# Patient Record
Sex: Female | Born: 1970 | Race: Black or African American | Hispanic: No | Marital: Single
Health system: Southern US, Community
[De-identification: ages and names within clinical notes are randomized; demographics above are authoritative.]

---

## 2019-11-21 ENCOUNTER — Emergency Department (HOSPITAL_COMMUNITY): Payer: Self-pay

## 2019-11-21 ENCOUNTER — Other Ambulatory Visit: Payer: Self-pay

## 2019-11-21 ENCOUNTER — Emergency Department (HOSPITAL_COMMUNITY)
Admission: EM | Admit: 2019-11-21 | Discharge: 2019-11-22 | Disposition: A | Discharge status: Home / Self Care | Payer: Self-pay | Attending: Emergency Medicine | Admitting: Emergency Medicine

## 2019-11-21 DIAGNOSIS — R42 Dizziness and giddiness: Secondary | ICD-10-CM | POA: Insufficient documentation

## 2019-11-21 DIAGNOSIS — Z5321 Procedure and treatment not carried out due to patient leaving prior to being seen by health care provider: Secondary | ICD-10-CM | POA: Insufficient documentation

## 2019-11-21 LAB — BASIC METABOLIC PANEL
Anion gap: 10 (ref 5–15)
BUN: 11 mg/dL (ref 6–20)
CO2: 22 mmol/L (ref 22–32)
Calcium: 9.4 mg/dL (ref 8.9–10.3)
Chloride: 103 mmol/L (ref 98–111)
Creatinine, Ser: 0.95 mg/dL (ref 0.44–1.00)
GFR calc Af Amer: >60 mL/min (ref >60)
GFR calc non Af Amer: >60 mL/min (ref >60)
Glucose, Bld: 148 mg/dL — ABNORMAL HIGH (ref 70–99)
Potassium: 3.4 mmol/L — ABNORMAL LOW (ref 3.5–5.1)
Sodium: 135 mmol/L (ref 135–145)

## 2019-11-21 LAB — I-STAT BETA HCG BLOOD, ED (MC, WL, AP ONLY): I-stat hCG, quantitative: <5 m[IU]/mL (ref <5)

## 2019-11-21 LAB — TROPONIN I (HIGH SENSITIVITY): Troponin I (High Sensitivity): <2 ng/L (ref <18)

## 2019-11-21 LAB — CBC
HCT: 35.2 % — ABNORMAL LOW (ref 36.0–46.0)
Hemoglobin: 11.2 g/dL — ABNORMAL LOW (ref 12.0–15.0)
MCH: 27.7 pg (ref 26.0–34.0)
MCHC: 31.8 g/dL (ref 30.0–36.0)
MCV: 87.1 fL (ref 80.0–100.0)
Platelets: 210 10*3/uL (ref 150–400)
RBC: 4.04 MIL/uL (ref 3.87–5.11)
RDW: 14.2 % (ref 11.5–15.5)
WBC: 6.5 10*3/uL (ref 4.0–10.5)
nRBC: 0 % (ref 0.0–0.2)

## 2019-11-21 MED ORDER — SODIUM CHLORIDE 0.9% FLUSH: 3.0000 mL | Freq: Once | INTRAVENOUS | Status: DC

## 2019-11-21 NOTE — ED Triage Notes (Signed)
Pt reports about one hour ago while lying on the cough she began to have pressure in the back of her neck and in her left upper back, denies any cp or sob. Pt reports feeling weak all over but no weakness noted in grip strength no drift in arms or legs. Speech clear, no change in vision. Pt is alert and ox4.

## 2019-11-22 NOTE — ED Notes (Signed)
Patient expresses desire to leave and her ride is on the way

## 2021-12-02 IMAGING — DX DG CHEST 2V
2 series · 2 of 2 positions shown · non-contrast
Comparison: None.

CLINICAL DATA: Back pain

EXAM:
CHEST - 2 VIEW

[chest pa]
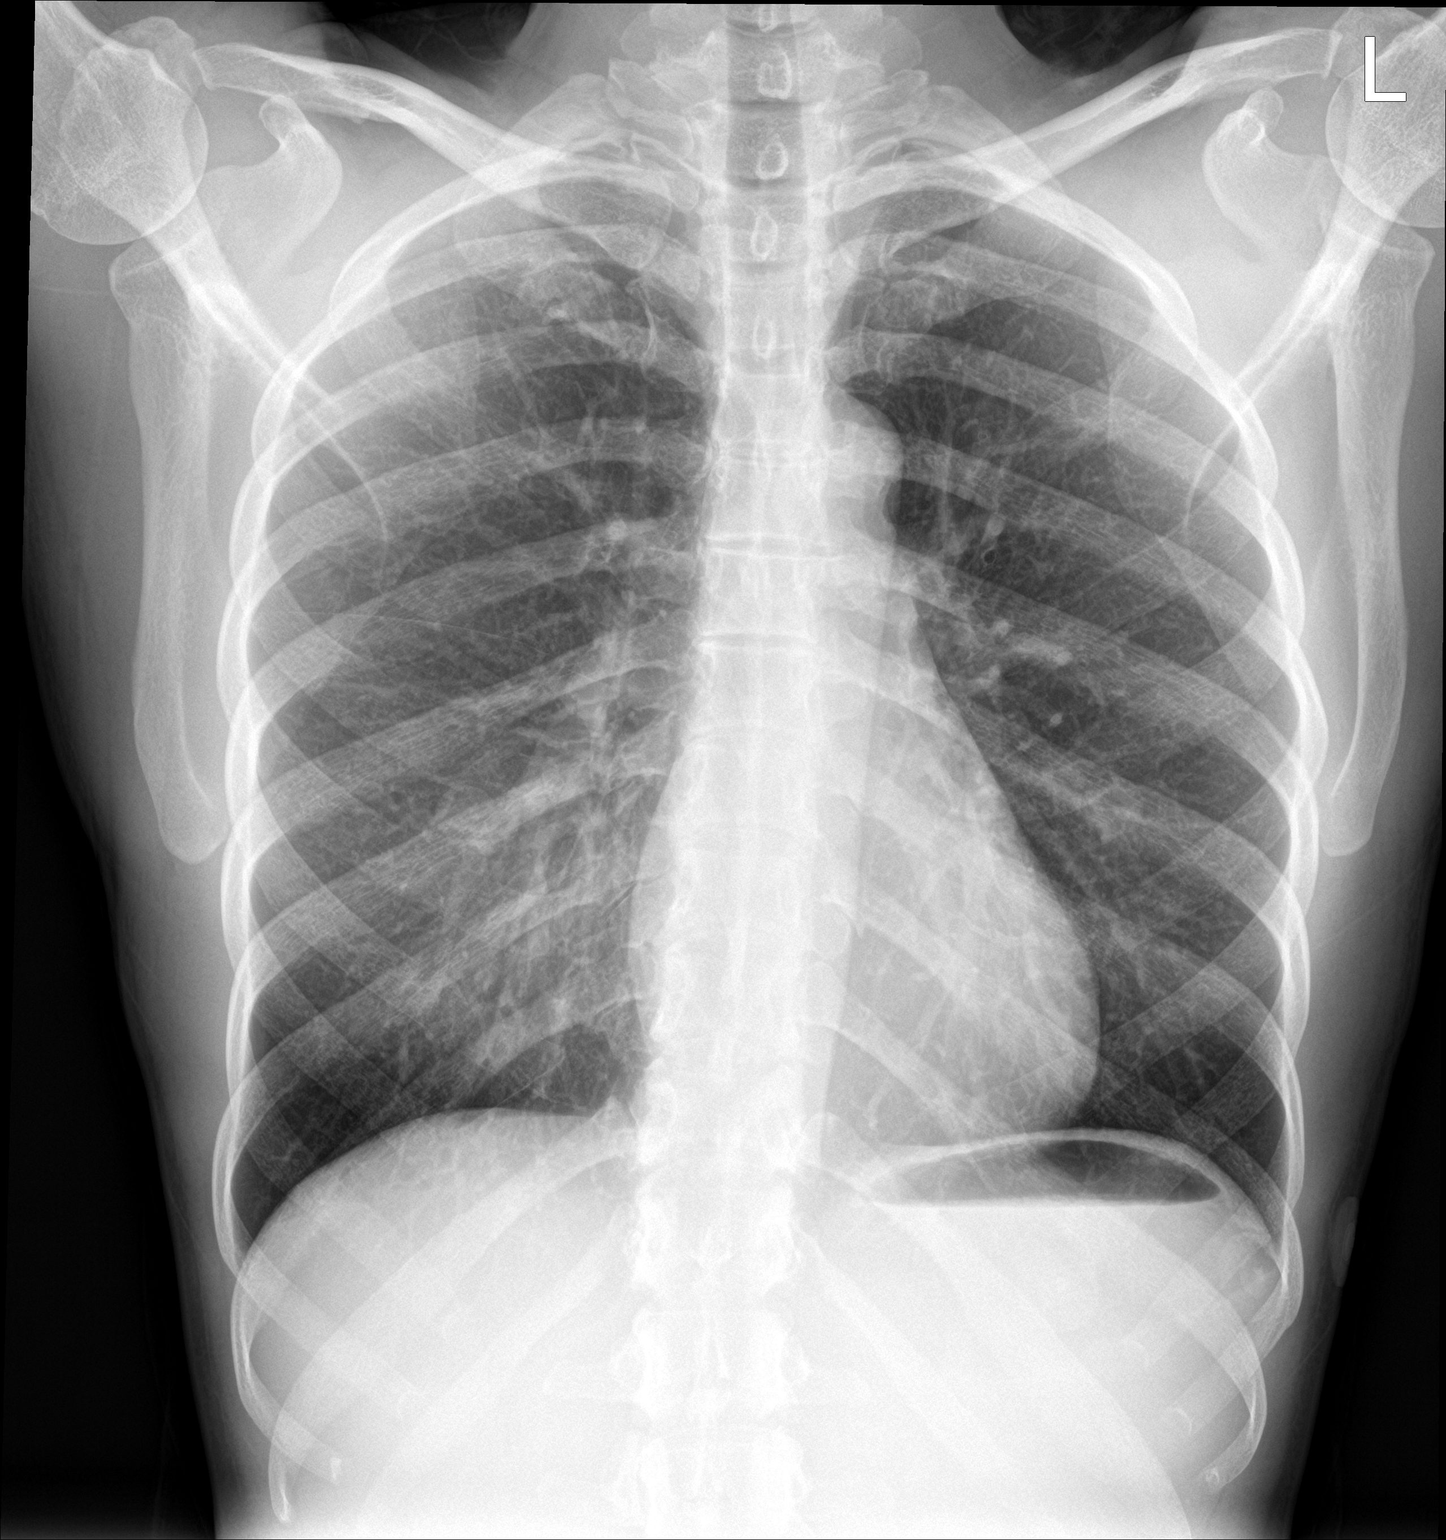

[chest lat]
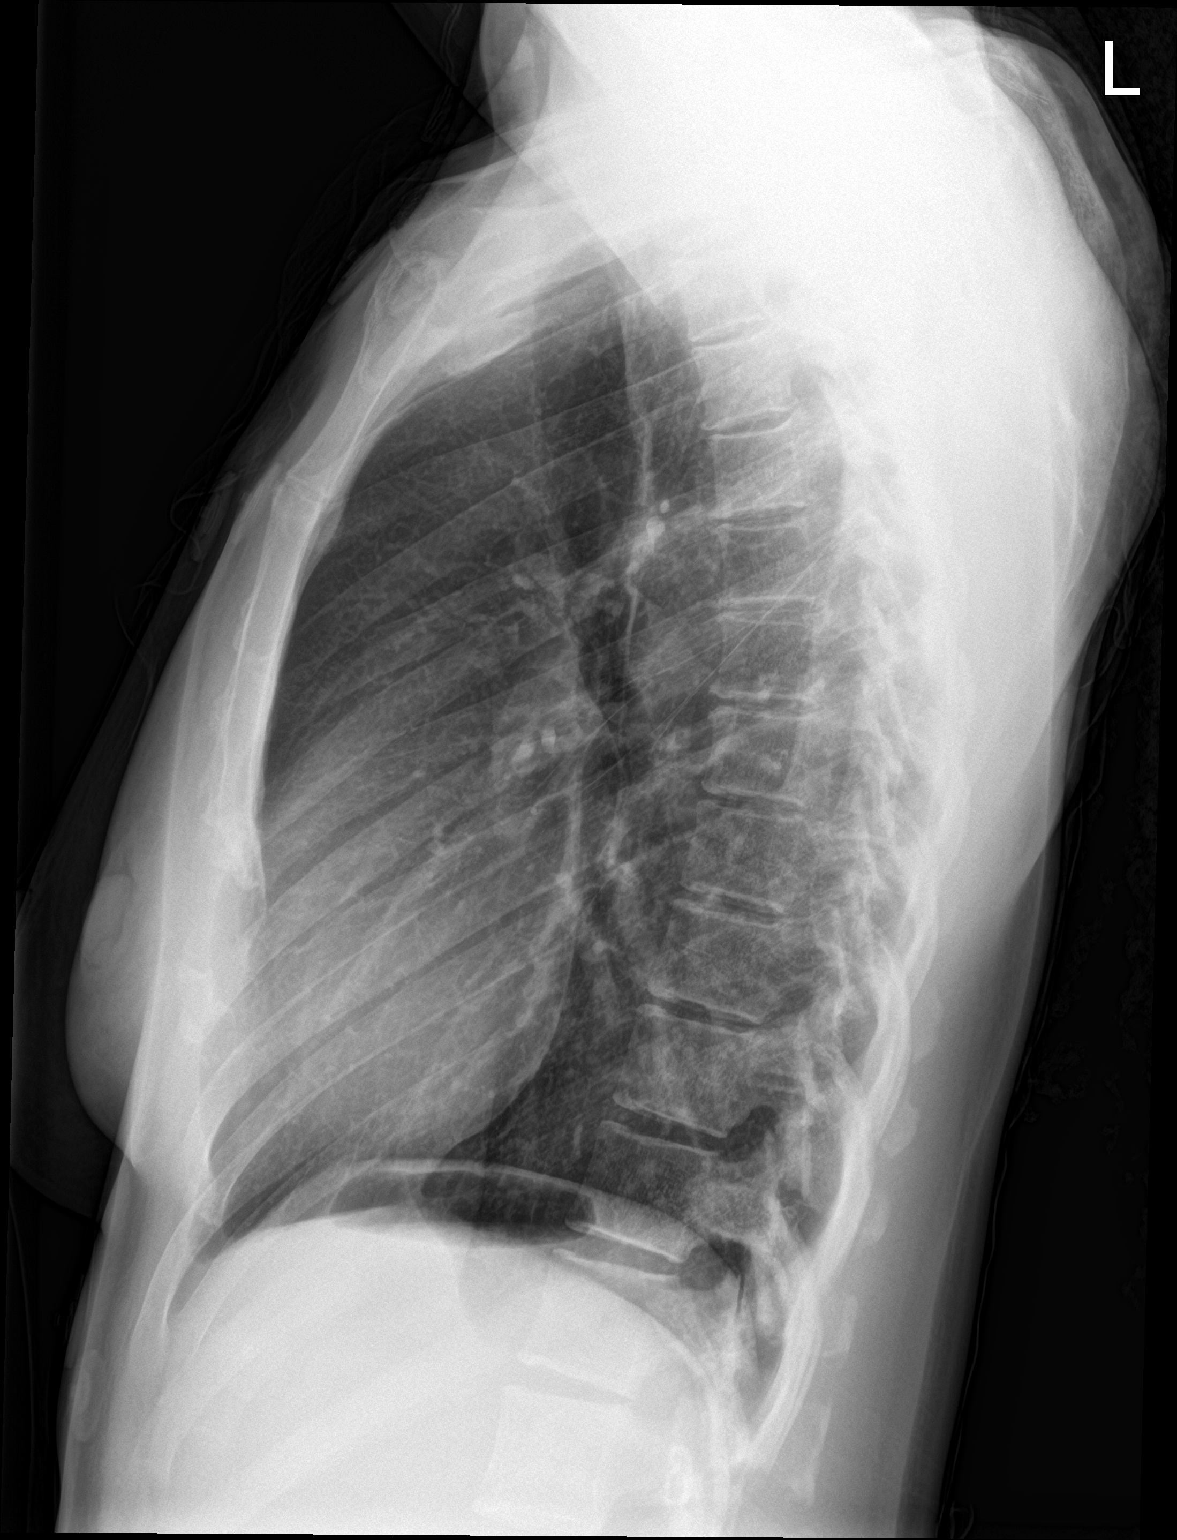

[2 of 2 positions shown; findings below may reference images not displayed]

FINDINGS: Cardiac shadows within normal limits. Thoracic aorta is
unremarkable. The lungs are clear. No focal infiltrate or effusion
is noted. No bony abnormality is seen.
IMPRESSION: No active cardiopulmonary disease.
